# Patient Record
Sex: Female | Born: 2000 | Race: White | Hispanic: No | Marital: Single | Smoking: Never smoker
Health system: Southern US, Community
[De-identification: ages and names within clinical notes are randomized; demographics above are authoritative.]

## PROBLEM LIST (undated history)

## (undated) DIAGNOSIS — E7801 Familial hypercholesterolemia: Secondary | ICD-10-CM

## (undated) DIAGNOSIS — T7840XA Allergy, unspecified, initial encounter: Secondary | ICD-10-CM

## (undated) DIAGNOSIS — L709 Acne, unspecified: Secondary | ICD-10-CM

## (undated) HISTORY — DX: Allergy, unspecified, initial encounter: T78.40XA

## (undated) HISTORY — DX: Acne, unspecified: L70.9

## (undated) HISTORY — DX: Familial hypercholesterolemia: E78.01

---
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
## 2022-01-13 ENCOUNTER — Encounter (INDEPENDENT_AMBULATORY_CARE_PROVIDER_SITE_OTHER): Payer: Self-pay | Admitting: Pediatrics

## 2022-01-13 ENCOUNTER — Telehealth (INDEPENDENT_AMBULATORY_CARE_PROVIDER_SITE_OTHER): Payer: Self-pay | Admitting: Pediatrics

## 2022-01-13 ENCOUNTER — Other Ambulatory Visit (INDEPENDENT_AMBULATORY_CARE_PROVIDER_SITE_OTHER): Payer: Self-pay | Admitting: Pediatrics

## 2022-01-13 DIAGNOSIS — E063 Autoimmune thyroiditis: Secondary | ICD-10-CM

## 2022-01-13 NOTE — Telephone Encounter (Signed)
  Name of who is calling: Jeana  Caller's Relationship to Patient: Mom   Best contact number: [phone] [name]) [phone] (mom)  Provider they see: Quincy Sheehan  Reason for call: Mom is wanting to know if Traci Wood needs to get labs done before her upcoming appointment.      PRESCRIPTION REFILL ONLY  Name of prescription:  Pharmacy:

## 2022-01-13 NOTE — Telephone Encounter (Signed)
See MyChart message.  Silvana Newness, MD 01/13/2022

## 2022-02-10 NOTE — Progress Notes (Deleted)
Pediatric Endocrinology Consultation Follow-up Visit  Traci Wood 2001-06-23 [id number]   HPI: Traci Wood  is a 21 y.o. female presenting for follow-up of autoimmune thyroiditis. She was previously managed by [facility], but her physician retired, and they established care 02/02/2021. she is accompanied to this visit by her ***.  Traci Wood was last seen at [facility] on 03/04/21.  Since last visit, recommended labs were not done before this visit. Levothyroxine was increased in 2022 for elevated TSH that improved from 5.58 to 3.51 with change to dose. ***   3. ROS: Greater than 10 systems reviewed with pertinent positives listed in HPI, otherwise neg.  The following portions of the patient's history were reviewed and updated as appropriate:  Past Medical History:  *** Past Medical History:  Diagnosis Date   Acne    Allergy     Meds: Outpatient Encounter Medications as of 02/13/2022  Medication Sig   Adapalene 0.3 % gel Apply topically at bedtime.   Cholecalciferol (VITAMIN D3 PO) Take by mouth.   ibuprofen (ADVIL) 100 MG/5ML suspension Take by mouth. (Patient not taking: No sig reported)   levothyroxine (SYNTHROID) 75 MCG tablet TAKE 1 TABLET BY MOUTH EVERY DAY   No facility-administered encounter medications on file as of 02/13/2022.    Allergies: Allergies  Allergen Reactions   Amoxicillin     Surgical History: No past surgical history on file.   Family History: Her father and paternal uncles have hypercholesterolemia. Her father takes lipitor.  There is no family history of thyroid cancer or autoimmune diseases.  Family History  Problem Relation Age of Onset   Hypertension Mother    Diabetes Mother        gestational diabetes   Polycystic ovary syndrome Mother    Anxiety disorder Father    Hyperlipidemia Father    Celiac disease Sister    Evelene Croon Parkinson White syndrome Brother    Hypertension Maternal Grandmother    Thyroid disease Maternal Grandmother    Angina  Maternal Grandmother    Hypertension Maternal Grandfather    Arrhythmia Maternal Grandfather    Anxiety disorder Paternal Grandmother    Macular degeneration Paternal Grandmother    COPD Paternal Grandmother    Cancer Paternal Grandfather    Thyroid disease Maternal Great-grandmother     Social History: Social History   Social History Narrative   She lives with mom and dad, 1 cat   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 There were no vitals taken for this visit. Body mass index: body mass index is unknown because there is no height or weight on file. Blood pressure %iles are not available for patients who are 18 years or older.  Wt Readings from Last 3 Encounters:  02/02/21 187 lb 9.6 oz (85.1 kg) (96 %, Z= 1.81)*  02/19/20 165 lb (74.8 kg) (93 %, Z= 1.45)*  08/24/10 64 lb (29 kg) (84 %, Z= 1.00)*   * Growth percentiles are based on CDC (Girls, 2-20 Years) data.   Ht Readings from Last 3 Encounters:  02/02/21 5' 6.73" (1.695 m) (84 %, Z= 0.99)*  02/19/20 5\' 6"  (1.676 m) (77 %, Z= 0.73)*  08/24/10 4' 4.5" (1.334 m) (93 %, Z= 1.47)*   * Growth percentiles are based on CDC (Girls, 2-20 Years) data.    Physical Exam   Labs: Results for orders placed or  performed in visit on 03/04/21  T4, free  Result Value Ref Range   Free T4 1.4 0.8 - 1.4 ng/dL  TSH  Result Value Ref Range   TSH 3.51 mIU/L  T3  Result Value Ref Range   T3, Total 145 86 - 192 ng/dL   40/34/74  TSH 2.59 (5.63-8.75 uIU/mL), FT4 0.8 (0.6-1.4 ng/mL), 25OH Vitamin D 34 ng/mL ?nonfasting lipid panel 08/12/20- TC 241, Trig 237, HDL 62, LDL 137.   02/16/2014 TSH 6.433, FT4 0.9, Th Ab attachment results not available. Phone call 02/24/2014 stated that thyroid antibodies were positive. Progress note 10/14/2014 stated that TPO Ab was positive. Assessment/Plan: Traci Wood is a 21 y.o.  female with ***   There are no diagnoses linked to this encounter.  No orders of the defined types were placed in this encounter.   No orders of the defined types were placed in this encounter.     Follow-up:   No follow-ups on file.   Medical decision-making:  I spent *** minutes dedicated to the care of this patient on the date of this encounter to include pre-visit review of labs/imaging/other provider notes, my interpretation of the bone age***, medically appropriate exam, face-to-face time with the patient, ordering of testing***, ordering of medication***, and documenting in the EHR.   Thank you for the opportunity to participate in the care of your patient. Please do not hesitate to contact me should you have any questions regarding the assessment or treatment plan.   Sincerely,   Silvana Newness, MD

## 2022-02-13 ENCOUNTER — Other Ambulatory Visit (INDEPENDENT_AMBULATORY_CARE_PROVIDER_SITE_OTHER): Payer: Self-pay | Admitting: Pediatrics

## 2022-02-13 ENCOUNTER — Telehealth (INDEPENDENT_AMBULATORY_CARE_PROVIDER_SITE_OTHER): Payer: 59 | Admitting: Pediatrics

## 2022-02-13 DIAGNOSIS — E063 Autoimmune thyroiditis: Secondary | ICD-10-CM

## 2022-02-14 LAB — TSH: TSH: 4.51 mIU/L — ABNORMAL HIGH

## 2022-02-14 LAB — T4, FREE: Free T4: 1.2 ng/dL (ref 0.8–1.4)

## 2022-03-08 ENCOUNTER — Other Ambulatory Visit (INDEPENDENT_AMBULATORY_CARE_PROVIDER_SITE_OTHER): Payer: Self-pay | Admitting: Pediatrics

## 2022-03-08 DIAGNOSIS — E063 Autoimmune thyroiditis: Secondary | ICD-10-CM

## 2022-03-30 ENCOUNTER — Telehealth (INDEPENDENT_AMBULATORY_CARE_PROVIDER_SITE_OTHER): Payer: BLUE CROSS/BLUE SHIELD | Admitting: Pediatrics

## 2022-03-30 ENCOUNTER — Encounter (INDEPENDENT_AMBULATORY_CARE_PROVIDER_SITE_OTHER): Payer: Self-pay | Admitting: Pediatrics

## 2022-03-30 VITALS — Wt 188.4 lb

## 2022-03-30 DIAGNOSIS — E063 Autoimmune thyroiditis: Secondary | ICD-10-CM | POA: Diagnosis not present

## 2022-03-30 DIAGNOSIS — E7801 Familial hypercholesterolemia: Secondary | ICD-10-CM | POA: Diagnosis not present

## 2022-03-30 MED ORDER — LEVOTHYROXINE SODIUM 75 MCG PO TABS: 75.0000 ug | ORAL_TABLET | Freq: Every day | ORAL | 11 refills | Status: DC

## 2022-03-30 NOTE — Progress Notes (Signed)
as This is a Pediatric Specialist E-Visit consult/follow up provided via My [place] (name of consenting adult) consented to an E-Visit consult today.  Location of patient: Traci Wood is at Home (location) Location of provider: Al Corpus, MD is at office (location) Patient was referred by Lura Em, MD   The following participants were involved in this E-Visit: Mike Gip, RN, mom, patient and Dr. Leana Roe (list of participants and their roles)  This visit was done via VIDEO   Chief Complain/ Reason for E-Visit today: The primary encounter diagnosis was Chronic lymphocytic thyroiditis. 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 Total time on call: 15 minutes Follow up: 1 year

## 2022-03-30 NOTE — Patient Instructions (Addendum)
Latest Reference Range & Units 02/03/21 09:37 05/24/21 11:30 02/13/22 11:12  TSH mIU/L 5.58 (H) 3.51 4.51 (H)  Triiodothyronine (T3) 86 - 192 ng/dL 413 244   W1,UUVO(ZDGUYQ) 0.8 - 1.4 ng/dL 1.4 1.4 1.2  (H): Data is abnormally high   Please obtain labs 1-2 weeks before the next visit. Remember to get labs done BEFORE the dose of levothyroxine, or 6 hours AFTER the dose of levothyroxine.   What is thyroid hormone?  Thyroid hormone is the medication prescribed by your child's doctor to treat hypothyroidism, also known as an underactive thyroid gland. The body makes 2 forms of thyroid hormone, levothyroxine (T4) and triiodothyronine (T3). Generally, prescribed thyroid hormone comes in the form of T4, which is converted by the body to the active form, T3. This medication is available in generic form as levothyroxine. Brand names you may encounter for this medication include Levothroid, Levoxyl, Synthroid,  and Unithroid. This medication comes in pill form. Babies who need thyroid hormone because of hypothyroidism must be given this medication on a regular basis so that their brains will develop normally. Babies and older children also need thyroid hormone for normal growth, among other important body functions.  How should thyroid hormone be given?  For babies and small children, because there is no reliable liquid preparation, the pill should be crushed just before administration and mixed with a small volume of water, human (breast) milk, or formula. This mixture can be given to the baby or small child using a spoon, dropper, or infant syringe. The spoon, dropper, or syringe should be "washed through" with more liquid 2 more times until all the thyroid hormone has been given. Making a mixture of crushed tablets and water or formula for storage is not recommended because this preparation is not stable. Some pharmacies will prepare a compounded suspension of levothyroxine, but it is only guaranteed to be  stable for a month and it is more expensive. Levothyroxine is tasteless and should not be a  problem to give.  Older children and teens should be encouraged to swallow the pills whole or with water or to chew the pills if they cannot swallow them. In general, thyroid hormone should be given at the same time of day every day. Despite the instructions you may receive from your pharmacy, thyroid hormone does not need to be taken on an empty stomach. However, its absorption may be affected by food, so it should be taken consistently with or without food.   However, please avoid consuming the following foods or supplements with the thyroid hormone because they may prevent the medicine from being fully absorbed:   Soy protein formulas or soy milk  Concentrated iron  Calcium supplements, aluminum hydroxide  Fiber supplements  Sucralfate  You do not need to worry about thyroid hormones interacting with other medications, as the medicine simply replaces a hormone that your child is no longer able to make. A good way to keep track of your child's doses is to get a 7-day pillbox and fill it at the beginning of the week. If one dose is missed, that dose should be taken as soon as possible. If you find out one day that the previous dose was missed, it is fine to double the dose the next day.  What are the side effects of thyroid hormone medication?  The rare side effects of thyroid hormone medication are related to overdose, or too much medication, and can include rapid heart rate, sweating, anxiety, and tremors. If  your child experiences these signs and symptoms, you should contact the physician who prescribed the medication for your child. A child will not have these problems if the thyroid hormone dose prescribed is only slightly more than is needed.  Is it OK to switch between brands of thyroid hormone medication?  Some endocrinologists believe that this may not always be a good idea. It is possible that  different brands have different bioavailability of the "free" hormone; therefore, if you need to switch between name brands or switch from a name brand to generic levothyroxine, you should let your endocrinologist know so your child's thyroid functions can be checked if the endocrinologist feels it is necessary to do so. Once-daily administration and close follow-up with your endocrinologist is needed to ensure the best possible results.  Pediatric Endocrinology Fact Sheet Thyroid Hormone Administration: A Guide for Families Copyright  2018 American Academy of Pediatrics and Pediatric Endocrine Society. All rights reserved. The information contained in this publication should not be used as a substitute for the medical care and advice of your pediatrician. There may be variations in treatment that your pediatrician may recommend based on individual facts and circumstances. Pediatric Endocrine Society/American Academy of Pediatrics  Section on Endocrinology Patient Education Committee

## 2022-03-30 NOTE — Progress Notes (Signed)
Pediatric Endocrinology Consultation Follow-up Visit  [place] Merchan April 28, 2001 [id number]  This is a Pediatric Specialist E-Visit consult/follow up provided via My [place] (name of consenting adult) consented to an E-Visit consult today.  Location of patient: Traci Wood is at Home (location) Location of provider: Al Corpus, MD is at office (location) Patient was referred by Traci Em, MD   The following participants were involved in this E-Visit: Traci Gip, RN, mom, patient and Dr. Leana Wood (list of participants and their roles)  This visit was done via VIDEO   Chief Complain/ Reason for E-Visit today: The primary encounter diagnosis was Chronic lymphocytic thyroiditis. 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 Total time on call: 15 minutes Follow up: 1 year   HPI: Traci Wood  is a 21 y.o. 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 Screening studies for dysmenorrhea in 2022 were normal except for mild elevation in DHEA-s and free testosterone.  She also has familial hypercholesterolemia with mildly elevated total cholesterol 02/03/21.   She was previously managed by [facility], but her physician retired, and they established care 02/02/2021. She established care with this practice 02/02/21. she is accompanied to this visit by her mother via [place].  Traci Wood was last seen at [facility] on 03/04/21.  Since last visit, she has been well. She is doing well in college. Over the summer, she had missed doses of levothyroxine 4mcg daily as she was busy working at a summer camp. She was also ill for 2-3 weeks. She recalls having fatigue during that time that has improved since being in a more regular routine with school starting. She may be due for an annual exam with her PCP.   3. ROS: Greater than 10 systems reviewed with pertinent positives listed in HPI,  otherwise neg.  The following portions of the patient's history were reviewed and updated as appropriate:  Past Medical History:   Past Medical History:  Diagnosis Date   Acne    Allergy    Autoimmune hypothyroidism 2015   02/16/2014 TSH 7.760, FT4 0.9, Th Ab attachment results not available. Phone call 02/24/2014 stated that thyroid antibodies were positive. Progress note 10/14/2014 stated that TPO Ab was positive.   Familial hypercholesteremia     Meds: Outpatient Encounter Medications as of 03/30/2022  Medication Sig   Cholecalciferol (VITAMIN D3 PO) Take by mouth.   [DISCONTINUED] levothyroxine (SYNTHROID) 75 MCG tablet TAKE 1 TABLET BY MOUTH EVERY DAY   levothyroxine (SYNTHROID) 75 MCG tablet Take 1 tablet (75 mcg total) by mouth daily.   [DISCONTINUED] Adapalene 0.3 % gel Apply topically at bedtime.   [DISCONTINUED] ibuprofen (ADVIL) 100 MG/5ML suspension Take by mouth. (Patient not taking: No sig reported)   No facility-administered encounter medications on file as of 03/30/2022.    Allergies: Allergies  Allergen Reactions   Amoxicillin     Surgical History: History reviewed. No pertinent surgical history.   Family History: Her father and paternal uncles have hypercholesterolemia. Her father takes lipitor. Family History  Problem Relation Age of Onset   Hypertension Mother    Diabetes Mother        gestational diabetes   Polycystic ovary syndrome Mother    Anxiety disorder Father    Hyperlipidemia Father    Celiac disease Sister    Yves Dill Parkinson White syndrome Brother    Hypertension Maternal Grandmother    Thyroid disease Maternal Grandmother    Angina Maternal Grandmother  Hypertension Maternal Grandfather    Arrhythmia Maternal Grandfather    Anxiety disorder Paternal Grandmother    Macular degeneration Paternal Grandmother    COPD Paternal Grandmother    Cancer Paternal Grandfather    Thyroid disease Maternal Great-grandmother     Social  History: Social History   Social History Narrative   She lives at college, with mom and dad, 1 cat   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 Blood pressure %iles are not available for patients who are 18 years or older.  Wt Readings from Last 3 Encounters:  03/30/22 188 lb 6.4 oz (85.5 kg) (96 %, Z= 1.78)*  02/02/21 187 lb 9.6 oz (85.1 kg) (96 %, Z= 1.81)*  02/19/20 165 lb (74.8 kg) (93 %, Z= 1.45)*   * Growth percentiles are based on CDC (Girls, 2-20 Years) data.   Ht Readings from Last 3 Encounters:  02/02/21 5' 6.73" (1.695 m) (84 %, Z= 0.99)*  02/19/20 5\' 6"  (1.676 m) (77 %, Z= 0.73)*  08/24/10 4' 4.5" (1.334 m) (93 %, Z= 1.47)*   * Growth percentiles are based on CDC (Girls, 2-20 Years) data.    Physical Exam Vitals reviewed.  Constitutional:      Appearance: Normal appearance. She is not toxic-appearing.  HENT:     Head: Normocephalic and atraumatic.     Nose: Nose normal.     Mouth/Throat:     Mouth: Mucous membranes are moist.  Eyes:     Extraocular Movements: Extraocular movements intact.  Pulmonary:     Effort: Pulmonary effort is normal.  Musculoskeletal:     Cervical back: Normal range of motion and neck supple.  Skin:    Coloration: Skin is not pale.  Neurological:     General: No focal deficit present.     Mental Status: She is alert.     Cranial Nerves: No cranial nerve deficit.  Psychiatric:        Mood and Affect: Mood normal.        Behavior: Behavior normal.        Thought Content: Thought content normal.        Judgment: Judgment normal.      Labs: Results for orders placed or performed in visit on 01/13/22  T4, free   Result Value Ref Range   Free T4 1.2 0.8 - 1.4 ng/dL  TSH  Result Value Ref Range   TSH 4.51 (H) mIU/L  06/30/20  TSH 3.88 (0.45-5.33 uIU/mL), FT4 0.8 (0.6-1.4 ng/mL), 25OH Vitamin D 34 ng/mL 08/12/20- TC 241, Trig 237, HDL 62, LDL 137. Assessment/Plan: Traci Wood is a 21 y.o. female with The primary encounter diagnosis was Chronic lymphocytic thyroiditis. 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   1. Chronic lymphocytic thyroiditis -clinically euthyroid now, but was hypothyroid over the summer when she was missing doses of levothyroxine -Missing of doses explains rise in TSH with normal thyroxine level - Continue levothyroxine (SYNTHROID) 75 MCG tablet; Take 1 tablet (75 mcg total) by mouth daily.  Dispense: 30 tablet; Refill: 11 -Labs at [place] before next visit or sooner if she has  any signs/sx of thyroid disease - T4, free - TSH -PES handout on thyroid hormone admin provided in AVS  2. Familial hypercholesteremia -Last total cholesterol mildly elevated -Recommend fasting lipid panel with next annual exam given paternal history of hypercholesterolemia  Orders Placed This Encounter  Procedures   T4, free   TSH    Meds ordered this encounter  Medications   levothyroxine (SYNTHROID) 75 MCG tablet    Sig: Take 1 tablet (75 mcg total) by mouth daily.    Dispense:  30 tablet    Refill:  11    Patient must keep upcoming appointment for further refills      Follow-up:   Return in about 1 year (around 03/31/2023), or if symptoms worsen or fail to improve, for follow up and review labs.   Medical decision-making:  I spent 20 minutes dedicated to the care of this patient on the date of this encounter to include pre-visit review of labs/imaging/other provider notes, medically appropriate exam, face-to-face time with the patient, ordering of testing, ordering of medication, and documenting in the EHR.   Thank you for the opportunity to  participate in the care of your patient. Please do not hesitate to contact me should you have any questions regarding the assessment or treatment plan.   Sincerely,   Al Corpus, MD

## 2022-06-22 IMAGING — DX DG TIBIA/FIBULA 2V*R*
4 series · 4 of 4 positions shown · non-contrast
Comparison: None.

CLINICAL DATA: Bilateral lower leg pain for 4-5 days. Question
stress fracture no known injury. The patient is a volleyball player.

EXAM:
LEFT TIBIA AND FIBULA - 2 VIEW; RIGHT TIBIA AND FIBULA - 2 VIEW

[tibia ap (1 of 2)]
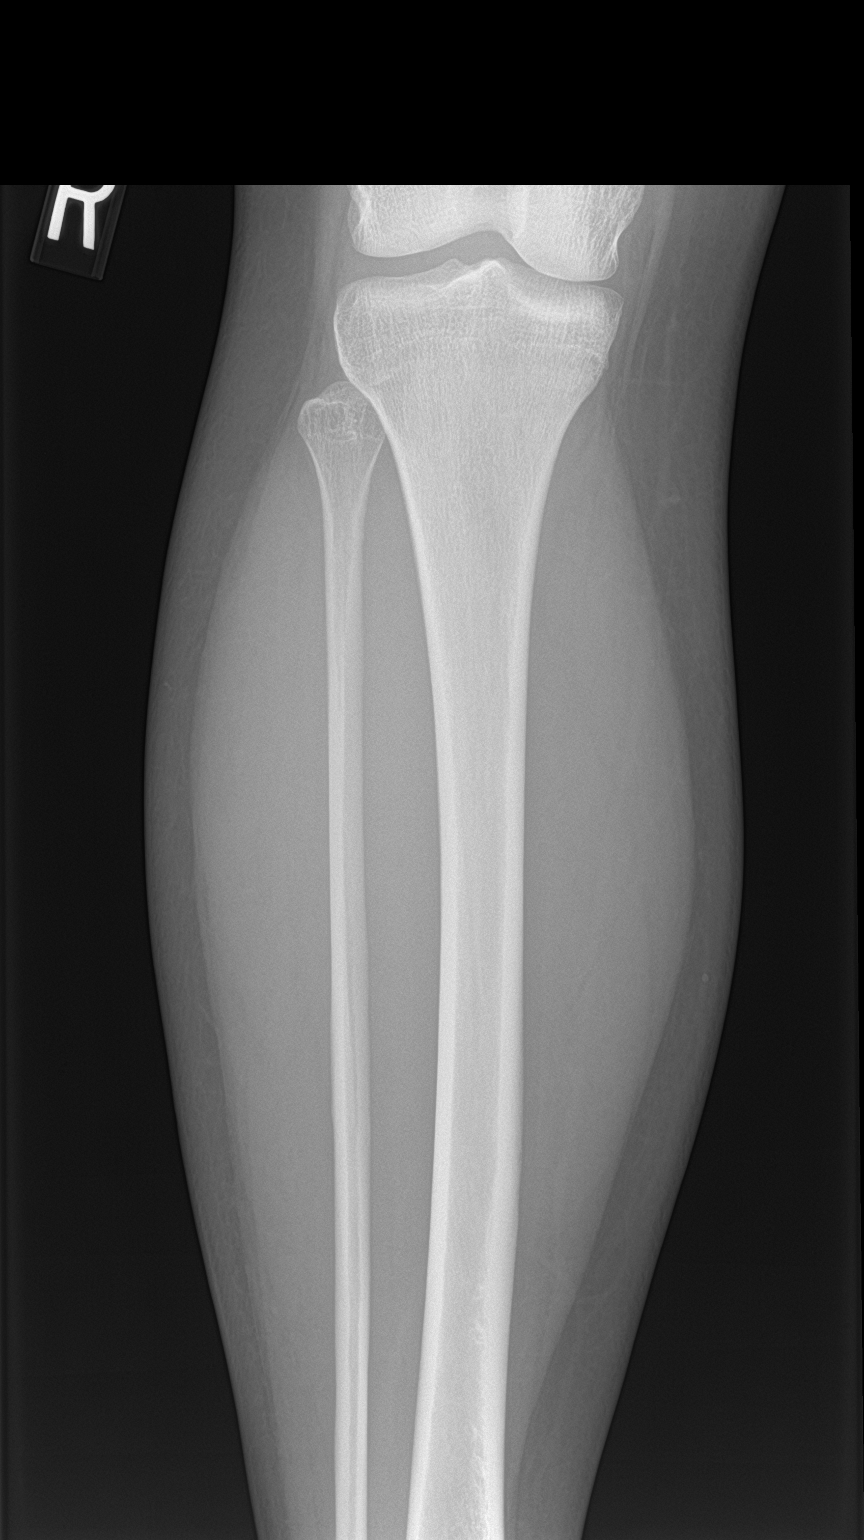

[tibia ap (2 of 2)]
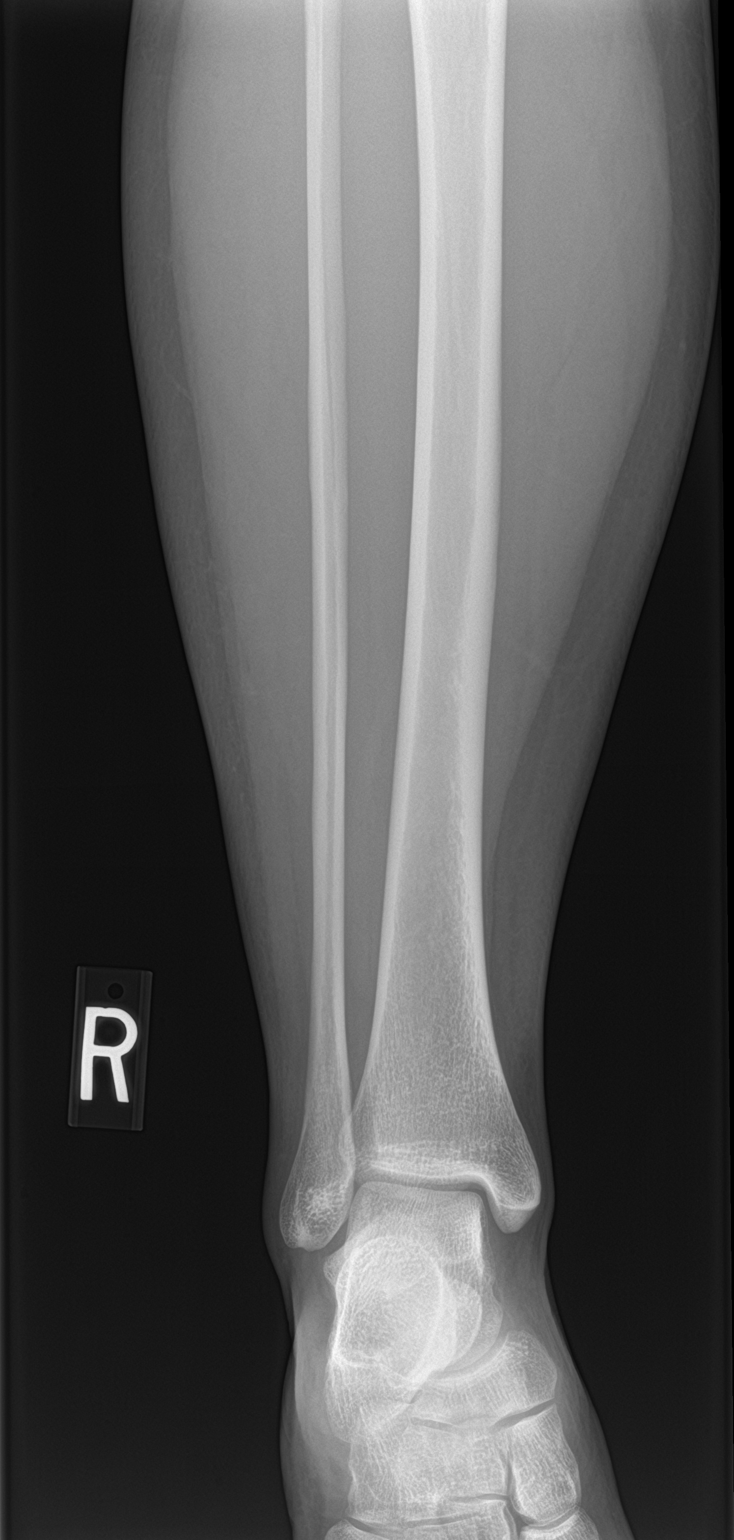

[tibia lat (1 of 2)]
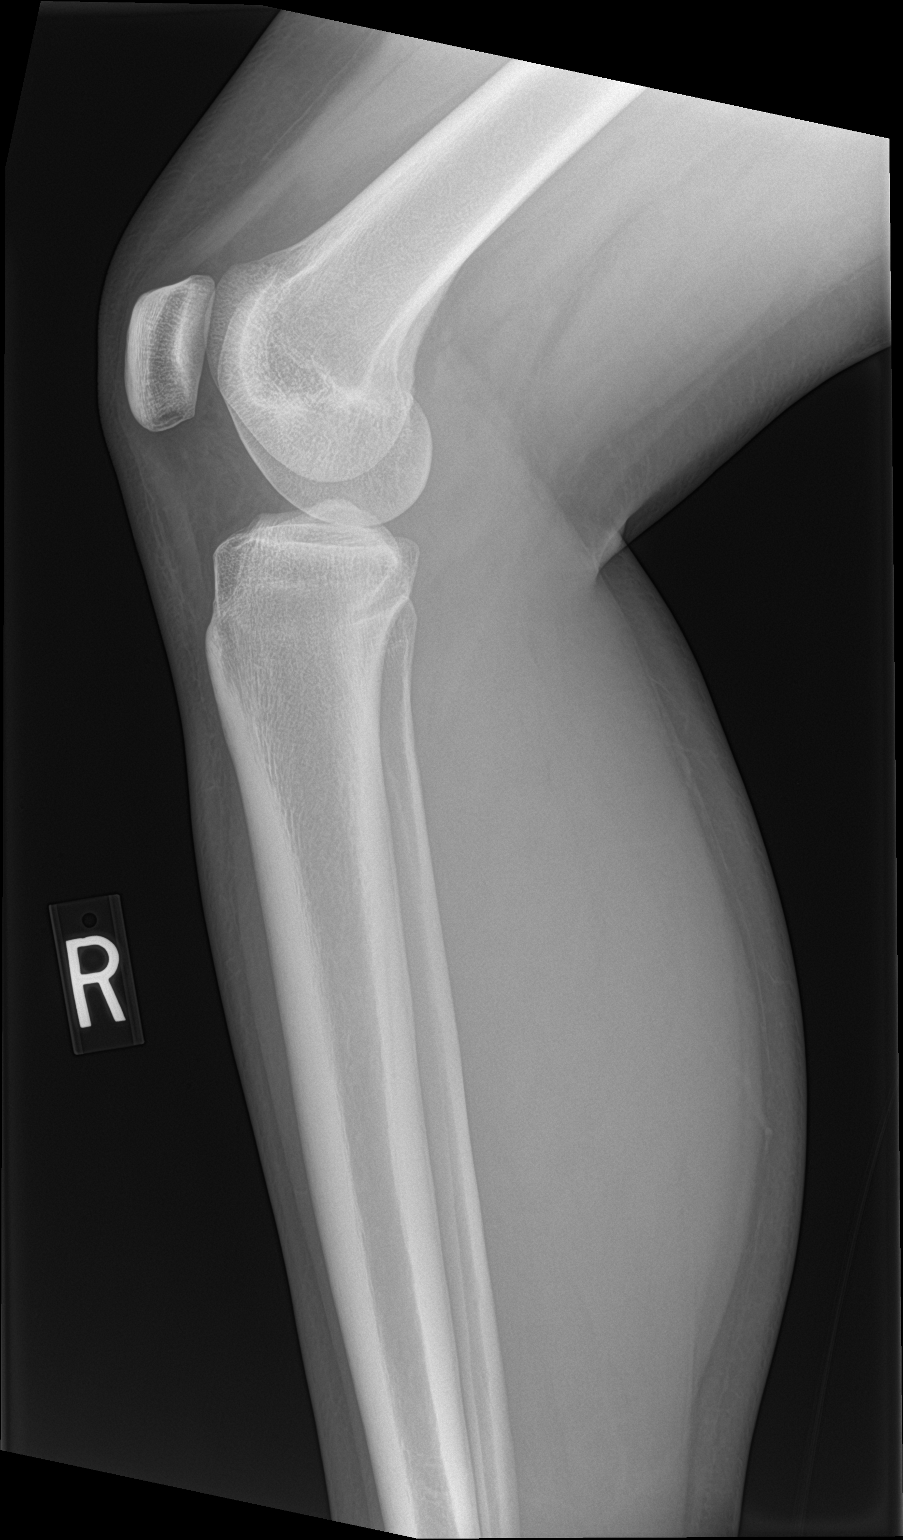

[tibia lat (2 of 2)]
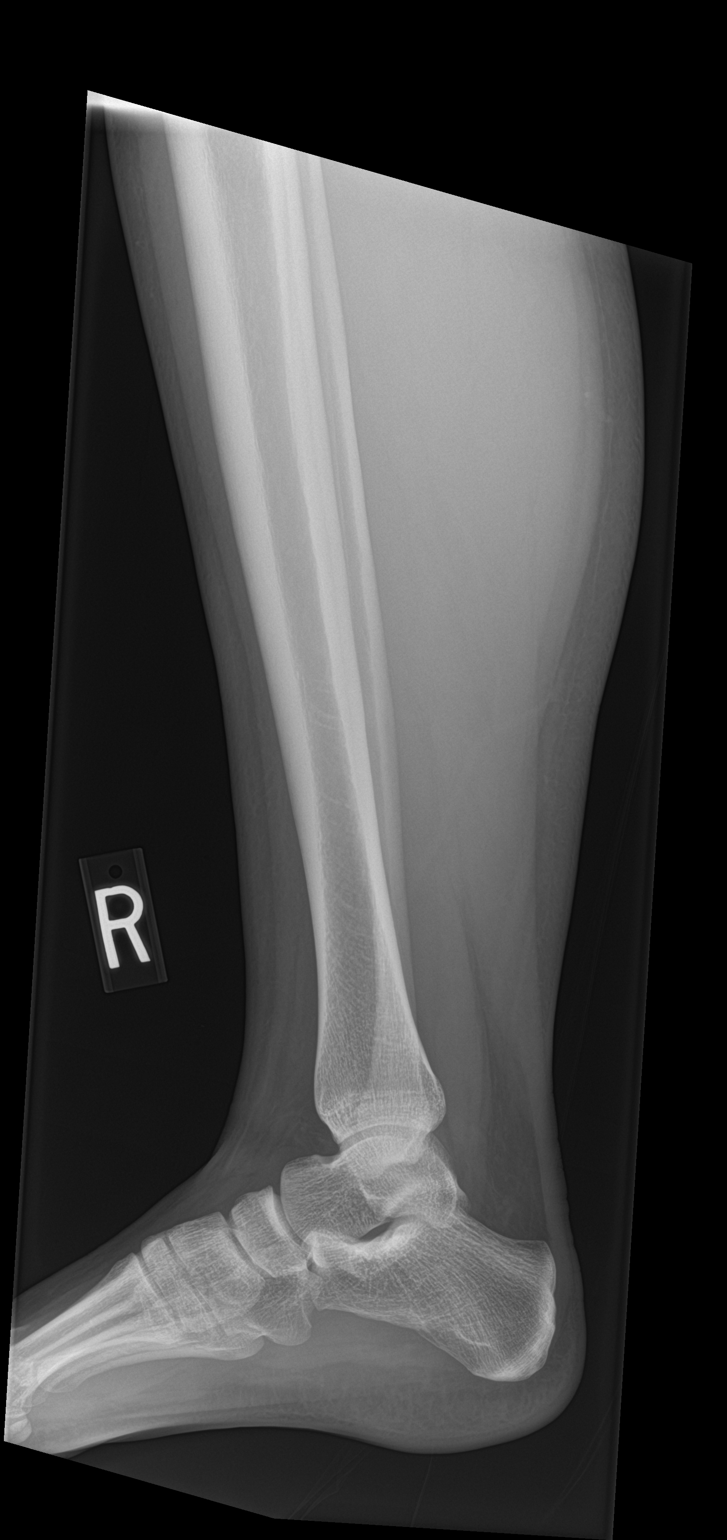

[4 of 4 positions shown; findings below may reference images not displayed]

FINDINGS: There is no evidence of fracture or other focal bone lesions. Soft
tissues are unremarkable.
IMPRESSION: Normal examination.

## 2022-10-16 ENCOUNTER — Encounter: Payer: Self-pay | Admitting: *Deleted

## 2023-04-03 ENCOUNTER — Other Ambulatory Visit (INDEPENDENT_AMBULATORY_CARE_PROVIDER_SITE_OTHER): Payer: Self-pay | Admitting: Pediatrics

## 2023-04-03 DIAGNOSIS — E063 Autoimmune thyroiditis: Secondary | ICD-10-CM

## 2023-05-01 ENCOUNTER — Telehealth (INDEPENDENT_AMBULATORY_CARE_PROVIDER_SITE_OTHER): Payer: Self-pay | Admitting: Pediatrics

## 2023-05-01 NOTE — Telephone Encounter (Signed)
Who's calling (name and relationship to patient) : Traci Wood; mom   Best contact number: [phone]  Provider they see: Dr. Quincy Sheehan  Reason for call: Mom has called in requesting a refill for Levothyroxine. Mom is also needing a request for lab orders.    Call ID:      PRESCRIPTION REFILL ONLY  Name of prescription:  Pharmacy:

## 2023-05-03 NOTE — Progress Notes (Signed)
Pediatric Endocrinology Consultation Follow-up Visit Traci Wood 12-09-2000 [id number] Janit Pagan, MD   HPI: Traci Wood  is a 22 y.o. female presenting for follow-up of Hypothyroidism.  she is accompanied to this visit by her mother. Interpreter present throughout the visit: No.  Traci Wood was last seen at [facility] on 03/30/2022.  Since last visit, she graduates college in May 2024. Traci Wood has been taking levo daily with no missed doses and last dose yesterday AM. There has been no intolerance, constipation/diarrhea, rapid heart rate, tremor, mood changes, poor energy, fatigue, dry skin, brittle hair/hair loss, nor changes in menses. She has been feeling colder.  She stopped taking vitamin D.   ROS: Greater than 10 systems reviewed with pertinent positives listed in HPI, otherwise neg. The following portions of the patient's history were reviewed and updated as appropriate:  Past Medical History:  has a past medical history of Acne, Allergy, Autoimmune hypothyroidism (2015), and Familial hypercholesteremia.  Meds: Current Outpatient Medications  Medication Instructions   Cholecalciferol (VITAMIN D3 PO) Oral   levothyroxine (SYNTHROID) 75 mcg, Oral, Daily    Allergies: Allergies  Allergen Reactions   Amoxicillin     Surgical History: History reviewed. No pertinent surgical history.  Family History: family history includes Angina in her maternal grandmother; Anxiety disorder in her father and paternal grandmother; Arrhythmia in her maternal grandfather; COPD in her paternal grandmother; Cancer in her paternal grandfather; Celiac disease in her sister; Diabetes in her mother; Hyperlipidemia in her father; Hypertension in her maternal grandfather, maternal grandmother, and mother; Macular degeneration in her paternal grandmother; Polycystic ovary syndrome in her mother; Thyroid disease in her maternal grandmother and maternal great-grandmother; Evelene Croon Parkinson White syndrome in  her brother.  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 She has never been exposed to tobacco smoke. She has never used smokeless tobacco. She reports that she does not drink alcohol and does not use drugs.  Physical Exam:  Vitals:   05/04/23 0925  BP: 120/60  Pulse: 100  Weight: 183 lb 14.4 oz (83.4 kg)  Height: 5' 6.65" (1.693 m)   BP 120/60   Pulse 100   Ht 5' 6.65" (1.693 m)   Wt 183 lb 14.4 oz (83.4 kg)   BMI 29.10 kg/m  Body mass index: body mass index is 29.1 kg/m. Growth %ile SmartLinks can only be used for patients less than 56 years old. Facility age limit for growth %iles is 20 years.  Wt Readings from Last 3 Encounters:  05/04/23 183 lb 14.4 oz (83.4 kg)  03/30/22 188 lb 6.4 oz (85.5 kg) (96%, Z= 1.78)*  02/02/21 187 lb 9.6 oz (85.1 kg) (96%, Z= 1.81)*   * Growth percentiles are based on CDC (Girls, 2-20 Years) data.   Ht Readings from Last 3 Encounters:  05/04/23 5' 6.65" (1.693 m)  02/02/21 5' 6.73" (1.695 m) (84%, Z= 0.99)*  02/19/20 5\' 6"  (1.676 m) (77%, Z= 0.73)*   * Growth percentiles are based on CDC (Girls, 2-20 Years) data.   Physical Exam Vitals reviewed.  Constitutional:      Appearance: Normal appearance. She is not toxic-appearing.  HENT:     Head: Normocephalic and atraumatic.     Nose: Nose normal.  Mouth/Throat:     Mouth: Mucous membranes are moist.  Eyes:     Extraocular Movements: Extraocular movements intact.  Neck:     Comments: No goiter, cobblestoning pattern, no nodules Pulmonary:     Effort: Pulmonary effort is normal. No respiratory distress.  Abdominal:     General: There is no distension.  Musculoskeletal:        General: Normal range of motion.     Cervical back: Normal range of motion  and neck supple.  Skin:    General: Skin is warm.     Capillary Refill: Capillary refill takes less than 2 seconds.  Neurological:     General: No focal deficit present.     Mental Status: She is alert.     Gait: Gait normal.  Psychiatric:        Mood and Affect: Mood normal.        Behavior: Behavior normal.      Labs: Results for orders placed or performed in visit on 01/13/22  T4, free  Result Value Ref Range   Free T4 1.2 0.8 - 1.4 ng/dL  TSH  Result Value Ref Range   TSH 4.51 (H) mIU/L    Assessment/Plan: [name] was seen today for chronic lymphocytic thyroiditis.  Chronic lymphocytic thyroiditis Overview: Acquired autoimmune hypothyroidism (TPO Ab + diagnosed in 2015) treated with levothyroxine. Screening studies for dysmenorrhea in 2022 were normal except for mild elevation in DHEA-s and free testosterone.  She also has familial hypercholesterolemia with mildly elevated total cholesterol 02/03/21.   She was previously managed by [facility], but her physician retired, and they established care 02/02/2021. She established care with this practice 02/02/21.   Assessment & Plan: -clinically euthyroid except for cold intolerance -continue levo and will adjust depending on TFTs obtained today in the office -she is graduating from college in May and is ready to transition to adult endo  Orders: -     T4, free -     TSH -     Levothyroxine Sodium; Take 1 tablet (75 mcg total) by mouth daily.  Dispense: 30 tablet; Refill: 0 -     Ambulatory referral to Endocrinology  Vitamin D deficiency -     VITAMIN D 25 Hydroxy (Vit-D Deficiency, Fractures)  Counseling for transition from pediatric to adult care provider    Patient Instructions  [facility] Address: [address], [place], [place] [address] Phone: [phone]   Remember to get labs done BEFORE the dose of levothyroxine, or 6 hours AFTER the dose of levothyroxine.  If dose changes or manufacturer  changes you need labs 6-8 weeks later to confirm that the dose is working for you.  What is thyroid hormone?  Thyroid hormone is the medication prescribed by your child's doctor to treat hypothyroidism, also known as an underactive thyroid gland. The body makes 2 forms of thyroid hormone, levothyroxine (T4) and triiodothyronine (T3). Generally, prescribed thyroid hormone comes in the form of T4, which is converted by the body to the active form, T3. This medication is available in generic form as levothyroxine. Brand names you may encounter for this medication include Levothroid, Levoxyl, Synthroid,  and Unithroid. This medication comes in pill form. Babies who need thyroid hormone because of hypothyroidism must be given this medication on a regular basis so that their brains will develop normally. Babies and older children also need thyroid hormone for normal growth, among other important body functions.  How should thyroid hormone be given?  For  babies and small children, because there is no reliable liquid preparation, the pill should be crushed just before administration and mixed with a small volume of water, human (breast) milk, or formula. This mixture can be given to the baby or small child using a spoon, dropper, or infant syringe. The spoon, dropper, or syringe should be "washed through" with more liquid 2 more times until all the thyroid hormone has been given. Making a mixture of crushed tablets and water or formula for storage is not recommended because this preparation is not stable. Some pharmacies will prepare a compounded suspension of levothyroxine, but it is only guaranteed to be stable for a month and it is more expensive. Levothyroxine is tasteless and should not be a  problem to give.  Older children and teens should be encouraged to swallow the pills whole or with water or to chew the pills if they cannot swallow them. In general, thyroid hormone should be given at the same time of  day every day. Despite the instructions you may receive from your pharmacy, thyroid hormone does not need to be taken on an empty stomach. However, its absorption may be affected by food, so it should be taken consistently with or without food.   However, please avoid consuming the following foods or supplements with the thyroid hormone because they may prevent the medicine from being fully absorbed:   Soy protein formulas or soy milk  Concentrated iron  Calcium supplements, aluminum hydroxide  Fiber supplements  Sucralfate  You do not need to worry about thyroid hormones interacting with other medications, as the medicine simply replaces a hormone that your child is no longer able to make. A good way to keep track of your child's doses is to get a 7-day pillbox and fill it at the beginning of the week. If one dose is missed, that dose should be taken as soon as possible. If you find out one day that the previous dose was missed, it is fine to double the dose the next day.  What are the side effects of thyroid hormone medication?  The rare side effects of thyroid hormone medication are related to overdose, or too much medication, and can include rapid heart rate, sweating, anxiety, and tremors. If your child experiences these signs and symptoms, you should contact the physician who prescribed the medication for your child. A child will not have these problems if the thyroid hormone dose prescribed is only slightly more than is needed.  Is it OK to switch between brands of thyroid hormone medication?  Some endocrinologists believe that this may not always be a good idea. It is possible that different brands have different bioavailability of the "free" hormone; therefore, if you need to switch between name brands or switch from a name brand to generic levothyroxine, you should let your endocrinologist know so your child's thyroid functions can be checked if the endocrinologist feels it is necessary  to do so. Once-daily administration and close follow-up with your endocrinologist is needed to ensure the best possible results.  Pediatric Endocrinology Fact Sheet Thyroid Hormone Administration: A Guide for Families Copyright  2018 American Academy of Pediatrics and Pediatric Endocrine Society. All rights reserved. The information contained in this publication should not be used as a substitute for the medical care and advice of your pediatrician. There may be variations in treatment that your pediatrician may recommend based on individual facts and circumstances. Pediatric Endocrine Society/American Academy of Pediatrics  Section on Endocrinology Patient Education  Committee   Follow-up:   Return in about 6 months (around 11/01/2023) for follow up.  Medical decision-making:  I have personally spent 32  minutes involved in face-to-face and non-face-to-face activities for this patient on the day of the visit. Professional time spent includes the following activities, in addition to those noted in the documentation: preparation time/chart review, ordering of medications/tests/procedures, obtaining and/or reviewing separately obtained history, counseling and educating the patient/family/caregiver, performing a medically appropriate examination and/or evaluation, referring and communicating with other health care professionals for care coordination, and documentation in the EHR.  Thank you for the opportunity to participate in the care of your patient. Please do not hesitate to contact me should you have any questions regarding the assessment or treatment plan.   Sincerely,   Silvana Newness, MD Addendum: 05/07/2023 Mychart sent  Latest Reference Range & Units 05/04/23 09:14  TSH mIU/L 3.20  T4,Free(Direct) 0.8 - 1.4 ng/dL 1.4   Meds ordered this encounter  Medications   DISCONTD: levothyroxine (SYNTHROID) 75 MCG tablet    Sig: Take 1 tablet (75 mcg total) by mouth daily.    Dispense:  30 tablet     Refill:  0    Patient must keep upcoming appointment for further refills   levothyroxine (SYNTHROID) 75 MCG tablet    Sig: Take 1 tablet (75 mcg total) by mouth daily.    Dispense:  90 tablet    Refill:  1

## 2023-05-04 ENCOUNTER — Ambulatory Visit (INDEPENDENT_AMBULATORY_CARE_PROVIDER_SITE_OTHER): Payer: BLUE CROSS/BLUE SHIELD | Admitting: Pediatrics

## 2023-05-04 ENCOUNTER — Encounter (INDEPENDENT_AMBULATORY_CARE_PROVIDER_SITE_OTHER): Payer: Self-pay | Admitting: Pediatrics

## 2023-05-04 VITALS — BP 120/60 | HR 100 | Ht 66.65 in | Wt 183.9 lb

## 2023-05-04 DIAGNOSIS — E063 Autoimmune thyroiditis: Secondary | ICD-10-CM

## 2023-05-04 DIAGNOSIS — E559 Vitamin D deficiency, unspecified: Secondary | ICD-10-CM | POA: Insufficient documentation

## 2023-05-04 DIAGNOSIS — Z7187 Encounter for pediatric-to-adult transition counseling: Secondary | ICD-10-CM | POA: Insufficient documentation

## 2023-05-04 DIAGNOSIS — E7800 Pure hypercholesterolemia, unspecified: Secondary | ICD-10-CM

## 2023-05-04 MED ORDER — LEVOTHYROXINE SODIUM 75 MCG PO TABS: 75.0000 ug | ORAL_TABLET | Freq: Every day | ORAL | 0 refills | Status: DC

## 2023-05-04 NOTE — Patient Instructions (Addendum)
[facility] Address: [address], [place], [place] [address] Phone: [phone]   Remember to get labs done BEFORE the dose of levothyroxine, or 6 hours AFTER the dose of levothyroxine.  If dose changes or manufacturer changes you need labs 6-8 weeks later to confirm that the dose is working for you.  What is thyroid hormone?  Thyroid hormone is the medication prescribed by your child's doctor to treat hypothyroidism, also known as an underactive thyroid gland. The body makes 2 forms of thyroid hormone, levothyroxine (T4) and triiodothyronine (T3). Generally, prescribed thyroid hormone comes in the form of T4, which is converted by the body to the active form, T3. This medication is available in generic form as levothyroxine. Brand names you may encounter for this medication include Levothroid, Levoxyl, Synthroid,  and Unithroid. This medication comes in pill form. Babies who need thyroid hormone because of hypothyroidism must be given this medication on a regular basis so that their brains will develop normally. Babies and older children also need thyroid hormone for normal growth, among other important body functions.  How should thyroid hormone be given?  For babies and small children, because there is no reliable liquid preparation, the pill should be crushed just before administration and mixed with a small volume of water, human (breast) milk, or formula. This mixture can be given to the baby or small child using a spoon, dropper, or infant syringe. The spoon, dropper, or syringe should be "washed through" with more liquid 2 more times until all the thyroid hormone has been given. Making a mixture of crushed tablets and water or formula for storage is not recommended because this preparation is not stable. Some pharmacies will prepare a compounded suspension of levothyroxine, but it is only guaranteed to be stable for a month and it is more expensive. Levothyroxine is tasteless  and should not be a  problem to give.  Older children and teens should be encouraged to swallow the pills whole or with water or to chew the pills if they cannot swallow them. In general, thyroid hormone should be given at the same time of day every day. Despite the instructions you may receive from your pharmacy, thyroid hormone does not need to be taken on an empty stomach. However, its absorption may be affected by food, so it should be taken consistently with or without food.   However, please avoid consuming the following foods or supplements with the thyroid hormone because they may prevent the medicine from being fully absorbed:   Soy protein formulas or soy milk  Concentrated iron  Calcium supplements, aluminum hydroxide  Fiber supplements  Sucralfate  You do not need to worry about thyroid hormones interacting with other medications, as the medicine simply replaces a hormone that your child is no longer able to make. A good way to keep track of your child's doses is to get a 7-day pillbox and fill it at the beginning of the week. If one dose is missed, that dose should be taken as soon as possible. If you find out one day that the previous dose was missed, it is fine to double the dose the next day.  What are the side effects of thyroid hormone medication?  The rare side effects of thyroid hormone medication are related to overdose, or too much medication, and can include rapid heart rate, sweating, anxiety, and tremors. If your child experiences these signs and symptoms, you should contact the physician who prescribed the medication for your child.  A child will not have these problems if the thyroid hormone dose prescribed is only slightly more than is needed.  Is it OK to switch between brands of thyroid hormone medication?  Some endocrinologists believe that this may not always be a good idea. It is possible that different brands have different bioavailability of the "free" hormone;  therefore, if you need to switch between name brands or switch from a name brand to generic levothyroxine, you should let your endocrinologist know so your child's thyroid functions can be checked if the endocrinologist feels it is necessary to do so. Once-daily administration and close follow-up with your endocrinologist is needed to ensure the best possible results.  Pediatric Endocrinology Fact Sheet Thyroid Hormone Administration: A Guide for Families Copyright  2018 American Academy of Pediatrics and Pediatric Endocrine Society. All rights reserved. The information contained in this publication should not be used as a substitute for the medical care and advice of your pediatrician. There may be variations in treatment that your pediatrician may recommend based on individual facts and circumstances. Pediatric Endocrine Society/American Academy of Pediatrics  Section on Endocrinology Patient Education Committee

## 2023-05-04 NOTE — Assessment & Plan Note (Signed)
-  clinically euthyroid except for cold intolerance -continue levo and will adjust depending on TFTs obtained today in the office -she is graduating from college in May and is ready to transition to adult endo

## 2023-05-05 LAB — T4, FREE: Free T4: 1.4 ng/dL (ref 0.8–1.4)

## 2023-05-05 LAB — TSH: TSH: 3.2 m[IU]/L

## 2023-05-05 LAB — VITAMIN D 25 HYDROXY (VIT D DEFICIENCY, FRACTURES): Vit D, 25-Hydroxy: 35 ng/mL (ref 30–100)

## 2023-05-07 MED ORDER — LEVOTHYROXINE SODIUM 75 MCG PO TABS: 75.0000 ug | ORAL_TABLET | Freq: Every day | ORAL | 1 refills | Status: AC

## 2023-05-07 NOTE — Progress Notes (Signed)
Normal labs, continue same dose of levothyroxine.  Rx sent to pharmacy.

## 2023-05-07 NOTE — Addendum Note (Signed)
Addended by: Morene Antu on: 05/07/2023 02:51 PM   Modules accepted: Orders

## 2023-05-11 ENCOUNTER — Encounter (INDEPENDENT_AMBULATORY_CARE_PROVIDER_SITE_OTHER): Payer: Self-pay

## 2023-05-17 ENCOUNTER — Encounter (INDEPENDENT_AMBULATORY_CARE_PROVIDER_SITE_OTHER): Payer: Self-pay

## 2023-07-18 ENCOUNTER — Other Ambulatory Visit (INDEPENDENT_AMBULATORY_CARE_PROVIDER_SITE_OTHER): Payer: Self-pay | Admitting: Pediatrics

## 2023-07-18 DIAGNOSIS — Z7187 Encounter for pediatric-to-adult transition counseling: Secondary | ICD-10-CM

## 2023-07-18 DIAGNOSIS — E063 Autoimmune thyroiditis: Secondary | ICD-10-CM

## 2023-11-21 ENCOUNTER — Ambulatory Visit (INDEPENDENT_AMBULATORY_CARE_PROVIDER_SITE_OTHER): Payer: BLUE CROSS/BLUE SHIELD | Admitting: Pediatrics

## 2023-11-21 ENCOUNTER — Encounter (INDEPENDENT_AMBULATORY_CARE_PROVIDER_SITE_OTHER): Payer: Self-pay
